# Patient Record
Sex: Male | Born: 1990 | Race: Black or African American | Hispanic: No | Marital: Single | Smoking: Current every day smoker
Health system: Southern US, Community
[De-identification: ages and names within clinical notes are randomized; demographics above are authoritative.]

## PROBLEM LIST (undated history)

## (undated) DIAGNOSIS — R011 Cardiac murmur, unspecified: Secondary | ICD-10-CM

## (undated) HISTORY — PX: TYMPANOSTOMY TUBE PLACEMENT: SHX32

---

## 2012-06-20 ENCOUNTER — Encounter (HOSPITAL_COMMUNITY): Payer: Self-pay | Admitting: *Deleted

## 2012-06-20 ENCOUNTER — Emergency Department (HOSPITAL_COMMUNITY)
Admission: EM | Admit: 2012-06-20 | Discharge: 2012-06-20 | Disposition: A | Discharge status: Home / Self Care | Payer: PRIVATE HEALTH INSURANCE | Attending: Emergency Medicine | Admitting: Emergency Medicine

## 2012-06-20 ENCOUNTER — Emergency Department (HOSPITAL_COMMUNITY): Payer: PRIVATE HEALTH INSURANCE

## 2012-06-20 DIAGNOSIS — Y9301 Activity, walking, marching and hiking: Secondary | ICD-10-CM | POA: Insufficient documentation

## 2012-06-20 DIAGNOSIS — W2209XA Striking against other stationary object, initial encounter: Secondary | ICD-10-CM | POA: Insufficient documentation

## 2012-06-20 DIAGNOSIS — S9032XA Contusion of left foot, initial encounter: Secondary | ICD-10-CM

## 2012-06-20 DIAGNOSIS — S9030XA Contusion of unspecified foot, initial encounter: Secondary | ICD-10-CM | POA: Insufficient documentation

## 2012-06-20 DIAGNOSIS — Y92009 Unspecified place in unspecified non-institutional (private) residence as the place of occurrence of the external cause: Secondary | ICD-10-CM | POA: Insufficient documentation

## 2012-06-20 MED ORDER — NAPROXEN 500 MG PO TABS: 500.0000 mg | ORAL_TABLET | Freq: Two times a day (BID) | ORAL | Status: DC

## 2012-06-20 NOTE — ED Provider Notes (Signed)
Medical screening examination/treatment/procedure(s) were performed by non-physician practitioner and as supervising physician I was immediately available for consultation/collaboration.  Flint Melter, MD 06/20/12 [phone]

## 2012-06-20 NOTE — ED Notes (Signed)
Pt reports he fell yesterday and hurt right foot. Pain 6/10 at present. Able to ambulate independently.

## 2012-06-20 NOTE — ED Notes (Addendum)
Pt reports he was going down the stairs when his R middle toe got caught in between the rails.  Small lac noted in between his R 4th and 3rd toes.  No active bleeding noted at this time.  Pt able to move his toes.  Mild swelling noted.  Pt denies falling and/or hitting his head.

## 2012-06-20 NOTE — ED Provider Notes (Signed)
History     CSN: [id number]  Arrival date & time 06/20/12  1144   First MD Initiated Contact with Patient 06/20/12 1218      Chief Complaint  Patient presents with  . Foot Pain    (Consider location/radiation/quality/duration/timing/severity/associated sxs/prior treatment) HPI Stephen Hart is a 22 y.o. male who presents to ED with complaint of a foot injury. States has walking down stairs barefoot in his house when he hit the railing, which split his great and 2nd toe, states hit his foot. Denies any other injuries. States took ibuprofen with some relief. States pain worsened with walking on that foot. Pain also with 2nd toe movement. No other complaints.    History reviewed. No pertinent past medical history.  History reviewed. No pertinent past surgical history.  History reviewed. No pertinent family history.  History  Substance Use Topics  . Smoking status: Never Smoker   . Smokeless tobacco: Not on file  . Alcohol Use: No      Review of Systems  Constitutional: Negative for fever and chills.  Musculoskeletal: Positive for joint swelling and arthralgias.  Neurological: Negative for weakness and numbness.    Allergies  Review of patient's allergies indicates not on file.  Home Medications  No current outpatient prescriptions on file.  There were no vitals taken for this visit.  Physical Exam  Nursing note and vitals reviewed. Constitutional: He appears well-developed and well-nourished. No distress.  Eyes: Conjunctivae are normal.  Cardiovascular: Normal rate, regular rhythm and normal heart sounds.   Pulmonary/Chest: Effort normal and breath sounds normal. No respiratory distress. He has no wheezes. He has no rales.  Musculoskeletal:  Normal appearing right foot. Tender to palpation over 2nd MTP joint. Pain with ROM of the great toe and 2nd toe. Small superficial abrasion between toe 2 and 3 on left foot. Full ROm of all toes. Normal cap refill <2 sec  distally of all toes.   Neurological: He is alert.  Skin: Skin is warm.    ED Course  Procedures (including critical care time)  No results found for this or any previous visit. Dg Foot Complete Right  06/20/2012  *RADIOLOGY REPORT*  Clinical Data: Traumatic injury with right foot pain  RIGHT FOOT COMPLETE - 3+ VIEW  Comparison: None.  Findings: No acute fracture or dislocation is noted.  A small accessory ossicle is noted laterally.  No soft tissue abnormality is seen.  IMPRESSION: No acute abnormality noted.   Original Report Authenticated By: Alcide Clever, M.D.       1. Foot contusion, left, initial encounter       MDM  Pt with foot injury yesterday. Exam unremarkable other than tenderness at MTP joint of the 2nd left toe. X-rays negative. Pt is ambulatory. Suspect contusion. Naprosyn at home, RICE therapy, follow up as needed.   Filed Vitals:   06/20/12 1241  BP: 128/72  Pulse: 69  Temp: 98.8 F (37.1 C)  TempSrc: Oral  Resp: 20  SpO2: 97%           Antoinette Haskett A Dalynn Jhaveri, PA-C 06/20/12 1323

## 2013-11-16 IMAGING — CR DG FOOT COMPLETE 3+V*R*
3 series · 3 of 3 positions shown · non-contrast
Comparison: None.

CLINICAL DATA: Traumatic injury with right foot pain

RIGHT FOOT COMPLETE - 3+ VIEW

[x foot ap right]
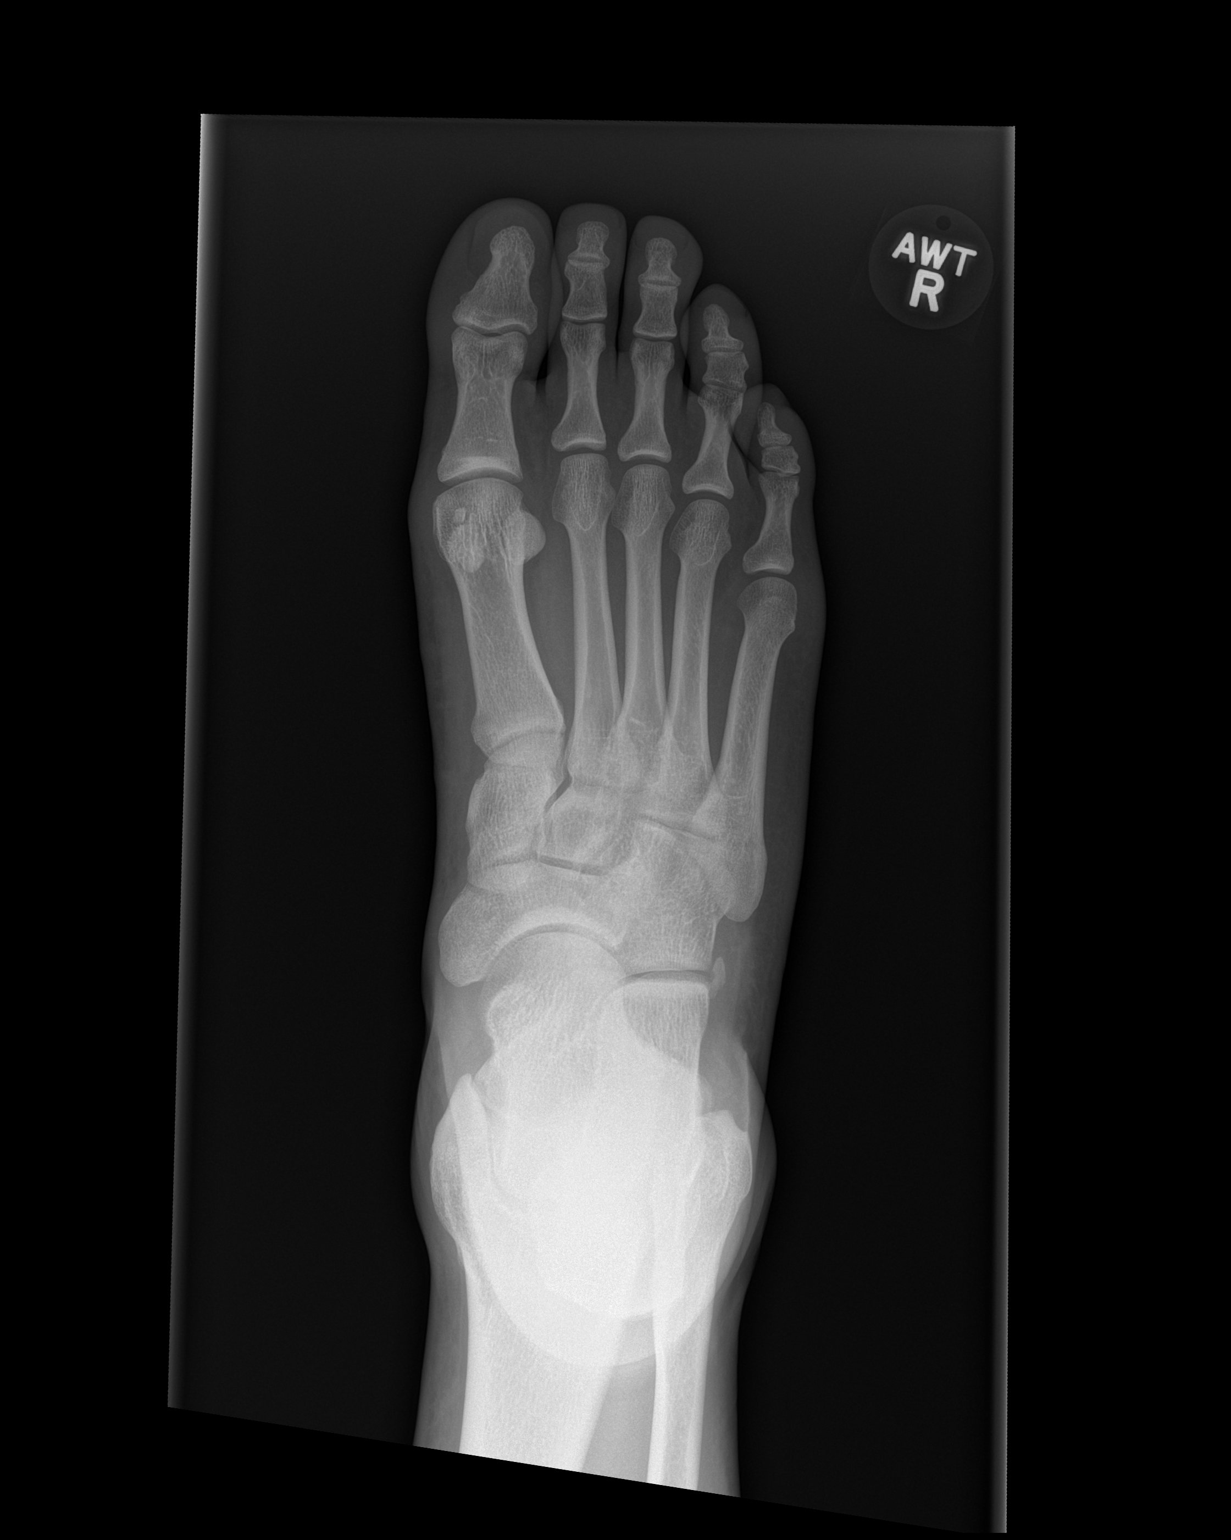

[x foot obl right]
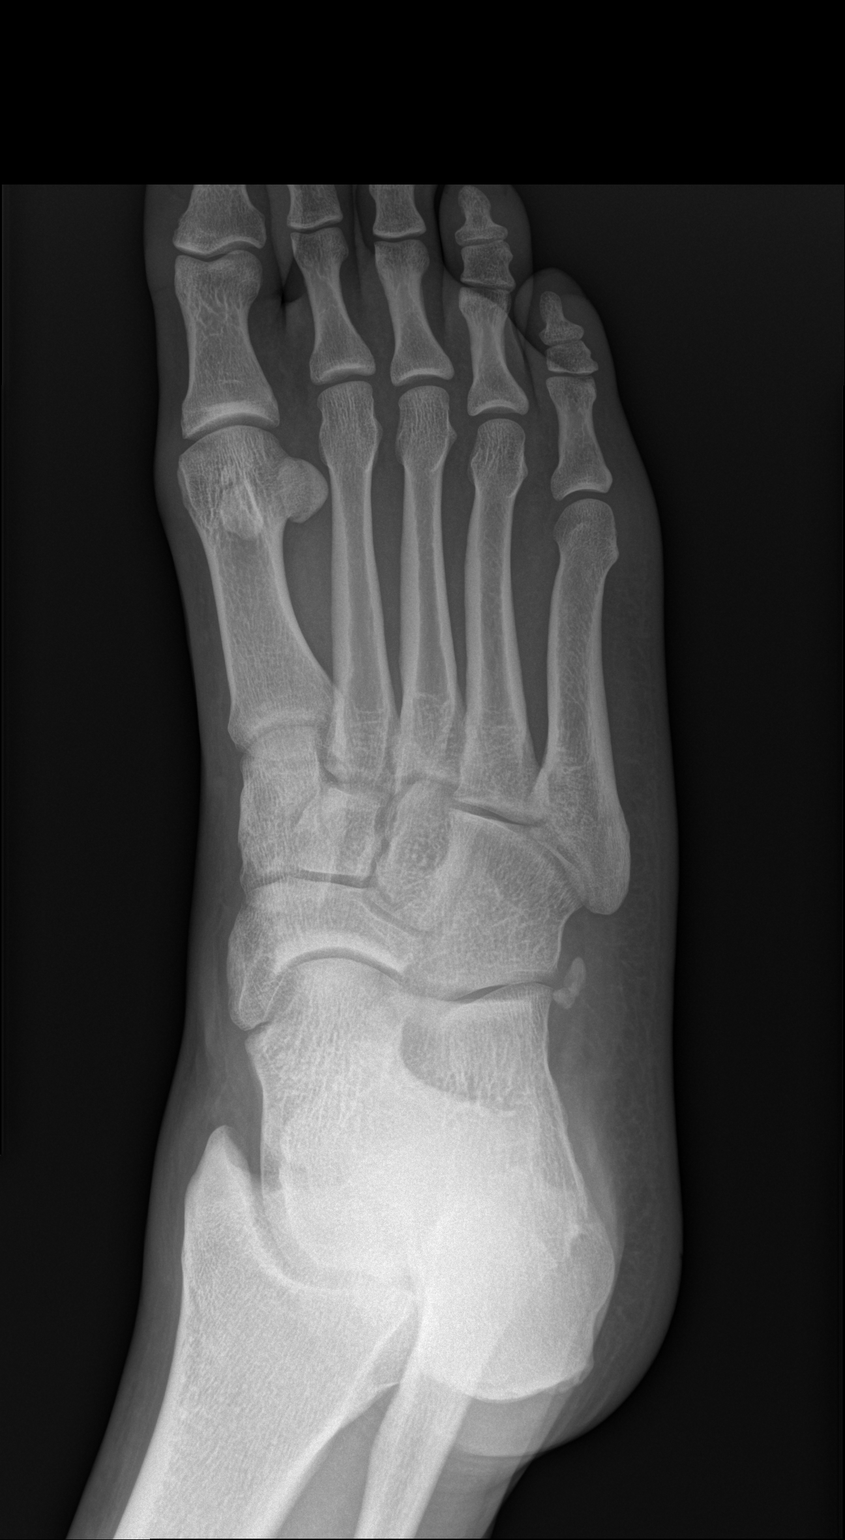

[x foot lat right]
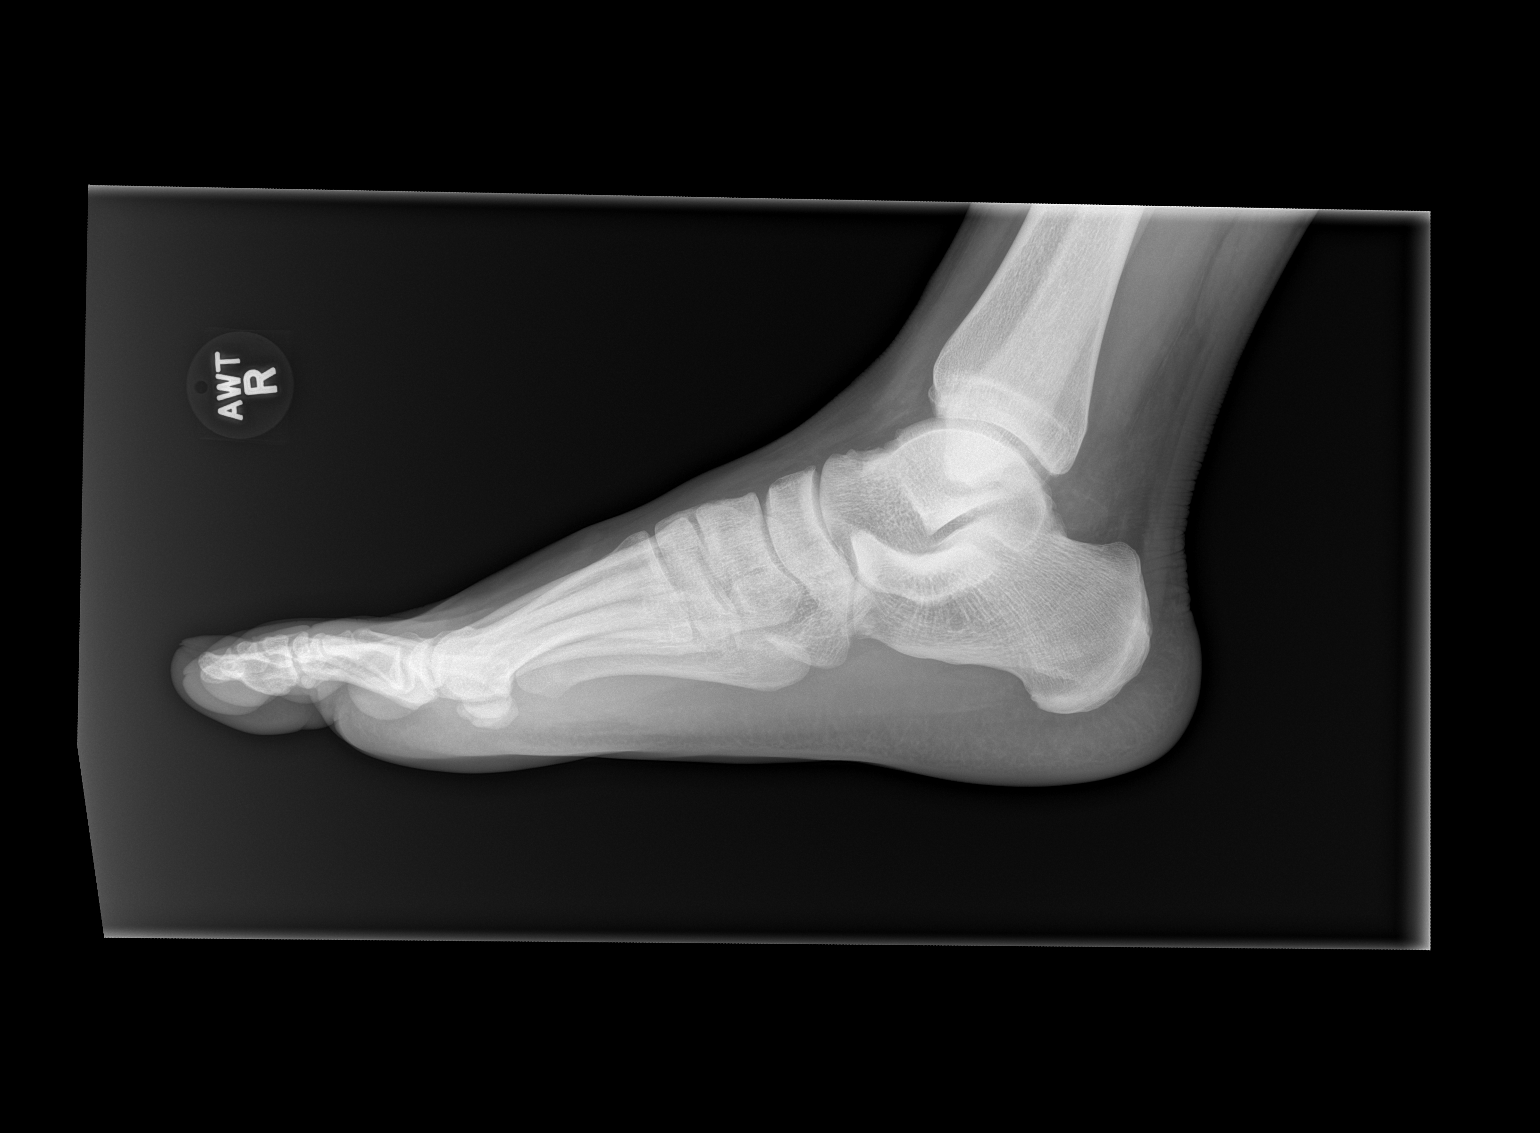

[3 of 3 positions shown; findings below may reference images not displayed]

FINDINGS: No acute fracture or dislocation is noted.  A small
accessory ossicle is noted laterally.  No soft tissue abnormality
is seen.
IMPRESSION: No acute abnormality noted.

## 2014-06-23 ENCOUNTER — Encounter (HOSPITAL_COMMUNITY): Payer: Self-pay | Admitting: *Deleted

## 2014-06-23 ENCOUNTER — Emergency Department (HOSPITAL_COMMUNITY)
Admission: EM | Admit: 2014-06-23 | Discharge: 2014-06-24 | Disposition: A | Discharge status: Home / Self Care | Payer: PRIVATE HEALTH INSURANCE | Attending: Emergency Medicine | Admitting: Emergency Medicine

## 2014-06-23 DIAGNOSIS — K088 Other specified disorders of teeth and supporting structures: Secondary | ICD-10-CM | POA: Insufficient documentation

## 2014-06-23 DIAGNOSIS — Z791 Long term (current) use of non-steroidal anti-inflammatories (NSAID): Secondary | ICD-10-CM | POA: Diagnosis not present

## 2014-06-23 DIAGNOSIS — Z720 Tobacco use: Secondary | ICD-10-CM | POA: Insufficient documentation

## 2014-06-23 DIAGNOSIS — K0889 Other specified disorders of teeth and supporting structures: Secondary | ICD-10-CM

## 2014-06-23 DIAGNOSIS — K029 Dental caries, unspecified: Secondary | ICD-10-CM | POA: Insufficient documentation

## 2014-06-23 DIAGNOSIS — Z880 Allergy status to penicillin: Secondary | ICD-10-CM | POA: Insufficient documentation

## 2014-06-23 DIAGNOSIS — K002 Abnormalities of size and form of teeth: Secondary | ICD-10-CM | POA: Diagnosis not present

## 2014-06-23 DIAGNOSIS — K0381 Cracked tooth: Secondary | ICD-10-CM | POA: Insufficient documentation

## 2014-06-23 DIAGNOSIS — R220 Localized swelling, mass and lump, head: Secondary | ICD-10-CM | POA: Diagnosis present

## 2014-06-23 NOTE — ED Notes (Signed)
Pt states that he thinks he may have chipped his tooth and is now experiencing dental pain. Tried whiskey and ibuprofen for pain with some relief.

## 2014-06-24 MED ORDER — DOXYCYCLINE HYCLATE 100 MG PO CAPS: 100.0000 mg | ORAL_CAPSULE | Freq: Two times a day (BID) | ORAL | Status: DC

## 2014-06-24 MED ORDER — HYDROCODONE-ACETAMINOPHEN 5-325 MG PO TABS: 1.0000 | ORAL_TABLET | Freq: Four times a day (QID) | ORAL | Status: DC | PRN

## 2014-06-24 MED ORDER — NAPROXEN 500 MG PO TABS: 500.0000 mg | ORAL_TABLET | Freq: Two times a day (BID) | ORAL | Status: DC | PRN

## 2014-06-24 NOTE — ED Provider Notes (Signed)
CSN: [id number]     Arrival date & time 06/23/14  2342 History  This chart was scribed for non-physician practitioner, Allen DerryMercedes Camprubi-Soms, PA-C working with Loren Raceravid Yelverton, MD by Greggory StallionKayla Andersen, ED scribe. This patient was seen in room TR08C/TR08C and the patient's care was started at 12:06 AM.   Chief Complaint  Patient presents with  . Facial Swelling   Patient is a 24 y.o. male presenting with tooth pain. The history is provided by the patient. No language interpreter was used.  Dental Pain Location:  Lower Lower teeth location:  19/LL 1st molar Quality: stabbing. Severity:  Moderate Onset quality:  Sudden Duration:  2 hours Timing:  Constant Progression:  Unchanged Chronicity:  New Context: dental fracture   Relieved by: iburpofen, orajel, and whiskey  Worsened by:  Pressure and cold food/drink (cold air, chewing, drinking) Ineffective treatments:  None tried Associated symptoms: facial pain, facial swelling, gum swelling and oral bleeding   Associated symptoms: no congestion, no difficulty swallowing, no drooling, no fever, no neck pain, no neck swelling, no oral lesions and no trismus   Risk factors: lack of dental care and smoking   Risk factors: no diabetes and no immunosuppression     HPI Comments: Stephen Hart is a 24 y.o. healthy male who presents to the Emergency Department complaining of left lower dental pain with associated facial and gum swelling that started earlier tonight when he broke a tooth eating chinese food. He denies pus drainage but states there has been bleeding from the area. He rates pain 6/10 and describes it as constant and stabbing, located on the entire L side of his face. Pain radiates into under his L jaw, and is worsened with cold air, chewing, and drinking. Pt has taken ibuprofen, whiskey, and used Orajel with some relief. He endorses having a small knot under the L side of his jaw which is tender. He denies fever, ear drainage, ear pain,  drooling, rhinorrhea, trismus, sore throat, difficulty swallowing, neck stiffness/pain, chest pain, SOB, abdominal pain, nausea, emesis, numbness, tingling, or weakness. Pt smokes cigarettes daily. He does not have a dentist.  Denies having diabetes or immunosuppressant medications/conditions.  History reviewed. No pertinent past medical history. History reviewed. No pertinent past surgical history. No family history on file. History  Substance Use Topics  . Smoking status: Never Smoker   . Smokeless tobacco: Not on file  . Alcohol Use: No    Review of Systems  Constitutional: Negative for fever and chills.  HENT: Positive for dental problem and facial swelling. Negative for congestion, drooling, ear discharge, ear pain, mouth sores, rhinorrhea, sore throat and trouble swallowing.   Respiratory: Negative for shortness of breath.   Cardiovascular: Negative for chest pain.  Gastrointestinal: Negative for nausea, vomiting, abdominal pain and diarrhea.  Musculoskeletal: Negative for neck pain and neck stiffness.  Skin: Negative for color change.  Allergic/Immunologic: Negative for immunocompromised state.  Neurological: Negative for weakness and numbness.  Hematological: Positive for adenopathy (L side of neck under jaw).  10 systems reviewed and are negative for acute changes except as noted in the HPI.  Allergie  Augmentin; Septra; Amoxicillin; and Penicillins  Home Medications   Prior to Admission medications   Medication Sig Start Date End Date Taking? Authorizing Provider  ibuprofen (ADVIL,MOTRIN) 200 MG tablet Take 1,000 mg by mouth every 6 (six) hours as needed for pain.    Historical Provider, MD  naproxen (NAPROSYN) 500 MG tablet Take 1 tablet (500 mg total) by mouth 2 (  two) times daily. 06/20/12   Tatyana Kirichenko, PA-C   BP 152/85 mmHg  Pulse 67  Temp(Src) 98 F (36.7 C) (Oral)  Resp 18  Ht  (1.905 m)  Wt 275 lb (124.739 kg)  BMI 34.37 kg/m2  SpO2 97%    Physical Exam  Constitutional: He is oriented to person, place, and time. Vital signs are normal. He appears well-developed and well-nourished.  Non-toxic appearance. No distress.  Afebrile, nontoxic, NAD  HENT:  Head: Normocephalic and atraumatic.  Right Ear: Hearing, tympanic membrane, external ear and ear canal normal.  Left Ear: Hearing, tympanic membrane, external ear and ear canal normal.  Nose: Nose normal.  Mouth/Throat: Uvula is midline, oropharynx is clear and moist and mucous membranes are normal. No oral lesions. No trismus in the jaw. Abnormal dentition. Dental caries present. No dental abscesses or uvula swelling.    Ears clear bilaterally. Left lower first molar #19 chipped around a filling, with exquisite TTP, no gingival erythema or swelling, no obvious abscess. Subfloor of mouth non TTP with no induration. Uvula midline with no swelling, no trismus or drooling. Multiple dental caries as well as dental fillings. No facial swelling.  Eyes: Conjunctivae and EOM are normal. Right eye exhibits no discharge. Left eye exhibits no discharge.  Neck: Normal range of motion. Neck supple.  Cardiovascular: Normal rate, regular rhythm, normal heart sounds and intact distal pulses.  Exam reveals no gallop and no friction rub.   No murmur heard. Pulmonary/Chest: Effort normal and breath sounds normal. No respiratory distress. He has no decreased breath sounds. He has no wheezes. He has no rhonchi. He has no rales.  Abdominal: Soft. Normal appearance and bowel sounds are normal. He exhibits no distension. There is no tenderness. There is no rigidity, no rebound and no guarding.  Musculoskeletal: Normal range of motion.  Lymphadenopathy:       Head (right side): No submandibular and no tonsillar adenopathy present.       Head (left side): Submandibular adenopathy present. No tonsillar adenopathy present.    He has no cervical adenopathy.  Left submandibular reactive LAD with mild TTP.    Neurological: He is alert and oriented to person, place, and time. He has normal strength. No sensory deficit.  Skin: Skin is warm, dry and intact. No rash noted.  Psychiatric: He has a normal mood and affect.  Nursing note and vitals reviewed.   ED Course  Procedures (including critical care time)  DIAGNOSTIC STUDIES: Oxygen Saturation is 97% on RA, normal by my interpretation.    COORDINATION OF CARE: 12:12 AM-Discussed treatment plan which includes doxycycline with pt at bedside and pt agreed to plan. Will give pt dental referrals and advised him to follow up.   Labs Review Labs Reviewed - No data to display  Imaging Review No results found.   EKG Interpretation None      MDM   Final diagnoses:  Pain, dental  Dental decay  Tobacco abuse    24 y.o. male here with Dental pain associated with dental fracture and possible dental infection with patient afebrile, non toxic appearing and swallowing secretions well. I gave patient referral to dentist and stressed the importance of dental follow up for ultimate management of dental pain. Counseled on smoking cessation.  I have also discussed reasons to return immediately to the ER.  Patient expresses understanding and agrees with plan.  I will also give doxycycline and pain control. Did not give any narcotics here since pt had consumed  alcohol prior to arrival. Discussed importance of not mixing narcotics with alcohol.  I personally performed the services described in this documentation, which was scribed in my presence. The recorded information has been reviewed and is accurate.  BP 152/85 mmHg  Pulse 67  Temp(Src) 98 F (36.7 C) (Oral)  Resp 18  Ht  (1.905 m)  Wt 275 lb (124.739 kg)  BMI 34.37 kg/m2  SpO2 97%  Meds ordered this encounter  Medications  . doxycycline (VIBRAMYCIN) 100 MG capsule    Sig: Take 1 capsule (100 mg total) by mouth 2 (two) times daily. One po bid x 7 days    Dispense:  14 capsule     Refill:  0    Order Specific Question:  Supervising Provider    Answer:  MILLER, BRIAN [[id number]]  . HYDROcodone-acetaminophen (NORCO) 5-325 MG per tablet    Sig: Take 1 tablet by mouth every 6 (six) hours as needed for severe pain.    Dispense:  6 tablet    Refill:  0    Order Specific Question:  Supervising Provider    Answer:  MILLER, BRIAN [[id number]]  . naproxen (NAPROSYN) 500 MG tablet    Sig: Take 1 tablet (500 mg total) by mouth 2 (two) times daily as needed for mild pain, moderate pain or headache (TAKE WITH MEALS.).    Dispense:  20 tablet    Refill:  0    Order Specific Question:  Supervising Provider    Answer:  Eber Hong [[id number]]     Hutchinson Isenberg Camprubi-Soms, PA-C 06/24/14 0030  Loren Racer, MD 06/24/14 [phone]

## 2014-06-24 NOTE — Discharge Instructions (Signed)
Apply warm compresses to jaw throughout the day. Take antibiotic until finished. Take naprosyn and norco as directed, as needed for pain but do not drive or operate machinery with pain medication use. Followup with a dentist is very important for ongoing evaluation and management of recurrent dental pain. Call one from the list below to have your tooth evaluated and treated. STOP SMOKING! Return to emergency department for emergent changing or worsening symptoms.    Dental Pain Toothache is pain in or around a tooth. It may get worse with chewing or with cold or heat.  HOME CARE  Your dentist may use a numbing medicine during treatment. If so, you may need to avoid eating until the medicine wears off. Ask your dentist about this.  Only take medicine as told by your dentist or doctor.  Avoid chewing food near the painful tooth until after all treatment is done. Ask your dentist about this. GET HELP RIGHT AWAY IF:   The problem gets worse or new problems appear.  You have a fever.  There is redness and puffiness (swelling) of the face, jaw, or neck.  You cannot open your mouth.  There is pain in the jaw.  There is very bad pain that is not helped by medicine. MAKE SURE YOU:   Understand these instructions.  Will watch your condition.  Will get help right away if you are not doing well or get worse. Document Released: 08/04/2007 Document Revised: 05/10/2011 Document Reviewed: 08/04/2007 [facility] Patient Information 2015 [place], [place]. This information is not intended to replace advice given to you by your health care provider. Make sure you discuss any questions you have with your health care provider.  Dental Caries Dental caries (also called tooth decay) is the most common oral disease. It can occur at any age but is more common in children and young adults.  HOW DENTAL CARIES DEVELOPS  The process of decay begins when bacteria and foods (particularly sugars and starches)  combine in your mouth to produce plaque. Plaque is a substance that sticks to the hard, outer surface of a tooth (enamel). The bacteria in plaque produce acids that attack enamel. These acids may also attack the root surface of a tooth (cementum) if it is exposed. Repeated attacks dissolve these surfaces and create holes in the tooth (cavities). If left untreated, the acids destroy the other layers of the tooth.  RISK FACTORS  Frequent sipping of sugary beverages.   Frequent snacking on sugary and starchy foods, especially those that easily get stuck in the teeth.   Poor oral hygiene.   Dry mouth.   Substance abuse such as methamphetamine abuse.   Broken or poor-fitting dental restorations.   Eating disorders.   Gastroesophageal reflux disease (GERD).   Certain radiation treatments to the head and neck. SYMPTOMS In the early stages of dental caries, symptoms are seldom present. Sometimes white, chalky areas may be seen on the enamel or other tooth layers. In later stages, symptoms may include:  Pits and holes on the enamel.  Toothache after sweet, hot, or cold foods or drinks are consumed.  Pain around the tooth.  Swelling around the tooth. DIAGNOSIS  Most of the time, dental caries is detected during a regular dental checkup. A diagnosis is made after a thorough medical and dental history is taken and the surfaces of your teeth are checked for signs of dental caries. Sometimes special instruments, such as lasers, are used to check for dental caries. Dental X-ray exams may  be taken so that areas not visible to the eye (such as between the contact areas of the teeth) can be checked for cavities.  TREATMENT  If dental caries is in its early stages, it may be reversed with a fluoride treatment or an application of a remineralizing agent at the dental office. Thorough brushing and flossing at home is needed to aid these treatments. If it is in its later stages, treatment depends  on the location and extent of tooth destruction:   If a small area of the tooth has been destroyed, the destroyed area will be removed and cavities will be filled with a material such as gold, silver amalgam, or composite resin.   If a large area of the tooth has been destroyed, the destroyed area will be removed and a cap (crown) will be fitted over the remaining tooth structure.   If the center part of the tooth (pulp) is affected, a procedure called a root canal will be needed before a filling or crown can be placed.   If most of the tooth has been destroyed, the tooth may need to be pulled (extracted). HOME CARE INSTRUCTIONS You can prevent, stop, or reverse dental caries at home by practicing good oral hygiene. Good oral hygiene includes:  Thoroughly cleaning your teeth at least twice a day with a toothbrush and dental floss.   Using a fluoride toothpaste. A fluoride mouth rinse may also be used if recommended by your dentist or health care provider.   Restricting the amount of sugary and starchy foods and sugary liquids you consume.   Avoiding frequent snacking on these foods and sipping of these liquids.   Keeping regular visits with a dentist for checkups and cleanings. PREVENTION   Practice good oral hygiene.  Consider a dental sealant. A dental sealant is a coating material that is applied by your dentist to the pits and grooves of teeth. The sealant prevents food from being trapped in them. It may protect the teeth for several years.  Ask about fluoride supplements if you live in a community without fluorinated water or with water that has a low fluoride content. Use fluoride supplements as directed by your dentist or health care provider.  Allow fluoride varnish applications to teeth if directed by your dentist or health care provider. Document Released: 11/07/2001 Document Revised: 07/02/2013 Document Reviewed: 02/18/2012 [facility] Patient Information 2015  [place], [place]. This information is not intended to replace advice given to you by your health care provider. Make sure you discuss any questions you have with your health care provider.  Smoking Cessation Quitting smoking is important to your health and has many advantages. However, it is not always easy to quit since nicotine is a very addictive drug. Oftentimes, people try 3 times or more before being able to quit. This document explains the best ways for you to prepare to quit smoking. Quitting takes hard work and a lot of effort, but you can do it. ADVANTAGES OF QUITTING SMOKING  You will live longer, feel better, and live better.  Your body will feel the impact of quitting smoking almost immediately.  Within 20 minutes, blood pressure decreases. Your pulse returns to its normal level.  After 8 hours, carbon monoxide levels in the blood return to normal. Your oxygen level increases.  After 24 hours, the chance of having a heart attack starts to decrease. Your breath, hair, and body stop smelling like smoke.  After 48 hours, damaged nerve endings begin to recover. Your  sense of taste and smell improve.  After 72 hours, the body is virtually free of nicotine. Your bronchial tubes relax and breathing becomes easier.  After 2 to 12 weeks, lungs can hold more air. Exercise becomes easier and circulation improves.  The risk of having a heart attack, stroke, cancer, or lung disease is greatly reduced.  After 1 year, the risk of coronary heart disease is cut in half.  After 5 years, the risk of stroke falls to the same as a nonsmoker.  After 10 years, the risk of lung cancer is cut in half and the risk of other cancers decreases significantly.  After 15 years, the risk of coronary heart disease drops, usually to the level of a nonsmoker.  If you are pregnant, quitting smoking will improve your chances of having a healthy baby.  The people you live with, especially any children, will be  healthier.  You will have extra money to spend on things other than cigarettes. QUESTIONS TO THINK ABOUT BEFORE ATTEMPTING TO QUIT You may want to talk about your answers with your health care provider.  Why do you want to quit?  If you tried to quit in the past, what helped and what did not?  What will be the most difficult situations for you after you quit? How will you plan to handle them?  Who can help you through the tough times? Your family? Friends? A health care provider?  What pleasures do you get from smoking? What ways can you still get pleasure if you quit? Here are some questions to ask your health care provider:  How can you help me to be successful at quitting?  What medicine do you think would be best for me and how should I take it?  What should I do if I need more help?  What is smoking withdrawal like? How can I get information on withdrawal? GET READY  Set a quit date.  Change your environment by getting rid of all cigarettes, ashtrays, matches, and lighters in your home, car, or work. Do not let people smoke in your home.  Review your past attempts to quit. Think about what worked and what did not. GET SUPPORT AND ENCOURAGEMENT You have a better chance of being successful if you have help. You can get support in many ways.  Tell your family, friends, and coworkers that you are going to quit and need their support. Ask them not to smoke around you.  Get individual, group, or telephone counseling and support. Programs are available at [employer] and health centers. Call your local health department for information about programs in your area.  Spiritual beliefs and practices may help some smokers quit.  Download a "quit meter" on your computer to keep track of quit statistics, such as how long you have gone without smoking, cigarettes not smoked, and money saved.  Get a self-help book about quitting smoking and staying off tobacco. LEARN NEW SKILLS  AND BEHAVIORS  Distract yourself from urges to smoke. Talk to someone, go for a walk, or occupy your time with a task.  Change your normal routine. Take a different route to work. Drink tea instead of coffee. Eat breakfast in a different place.  Reduce your stress. Take a hot bath, exercise, or read a book.  Plan something enjoyable to do every day. Reward yourself for not smoking.  Explore interactive web-based programs that specialize in helping you quit. GET MEDICINE AND USE IT CORRECTLY Medicines can help you  stop smoking and decrease the urge to smoke. Combining medicine with the above behavioral methods and support can greatly increase your chances of successfully quitting smoking.  Nicotine replacement therapy helps deliver nicotine to your body without the negative effects and risks of smoking. Nicotine replacement therapy includes nicotine gum, lozenges, inhalers, nasal sprays, and skin patches. Some may be available over-the-counter and others require a prescription.  Antidepressant medicine helps people abstain from smoking, but how this works is unknown. This medicine is available by prescription.  Nicotinic receptor partial agonist medicine simulates the effect of nicotine in your brain. This medicine is available by prescription. Ask your health care provider for advice about which medicines to use and how to use them based on your health history. Your health care provider will tell you what side effects to look out for if you choose to be on a medicine or therapy. Carefully read the information on the package. Do not use any other product containing nicotine while using a nicotine replacement product.  RELAPSE OR DIFFICULT SITUATIONS Most relapses occur within the first 3 months after quitting. Do not be discouraged if you start smoking again. Remember, most people try several times before finally quitting. You may have symptoms of withdrawal because your body is used to nicotine.  You may crave cigarettes, be irritable, feel very hungry, cough often, get headaches, or have difficulty concentrating. The withdrawal symptoms are only temporary. They are strongest when you first quit, but they will go away within 10-14 days. To reduce the chances of relapse, try to:  Avoid drinking alcohol. Drinking lowers your chances of successfully quitting.  Reduce the amount of caffeine you consume. Once you quit smoking, the amount of caffeine in your body increases and can give you symptoms, such as a rapid heartbeat, sweating, and anxiety.  Avoid smokers because they can make you want to smoke.  Do not let weight gain distract you. Many smokers will gain weight when they quit, usually less than 10 pounds. Eat a healthy diet and stay active. You can always lose the weight gained after you quit.  Find ways to improve your mood other than smoking. FOR MORE INFORMATION  www.smokefree.gov  Document Released: 02/09/2001 Document Revised: 07/02/2013 Document Reviewed: 05/27/2011 [facility] Patient Information 2015 [place], [place]. This information is not intended to replace advice given to you by your health care provider. Make sure you discuss any questions you have with your health care provider.  Smoking Cessation, Tips for Success If you are ready to quit smoking, congratulations! You have chosen to help yourself be healthier. Cigarettes bring nicotine, tar, carbon monoxide, and other irritants into your body. Your lungs, heart, and blood vessels will be able to work better without these poisons. There are many different ways to quit smoking. Nicotine gum, nicotine patches, a nicotine inhaler, or nicotine nasal spray can help with physical craving. Hypnosis, support groups, and medicines help break the habit of smoking. WHAT THINGS CAN I DO TO MAKE QUITTING EASIER?  Here are some tips to help you quit for good:  Pick a date when you will quit smoking completely. Tell all of your friends  and family about your plan to quit on that date.  Do not try to slowly cut down on the number of cigarettes you are smoking. Pick a quit date and quit smoking completely starting on that day.  Throw away all cigarettes.   Clean and remove all ashtrays from your home, work, and car.  On a card,  write down your reasons for quitting. Carry the card with you and read it when you get the urge to smoke.  Cleanse your body of nicotine. Drink enough water and fluids to keep your urine clear or pale yellow. Do this after quitting to flush the nicotine from your body.  Learn to predict your moods. Do not let a bad situation be your excuse to have a cigarette. Some situations in your life might tempt you into wanting a cigarette.  Never have "just one" cigarette. It leads to wanting another and another. Remind yourself of your decision to quit.  Change habits associated with smoking. If you smoked while driving or when feeling stressed, try other activities to replace smoking. Stand up when drinking your coffee. Brush your teeth after eating. Sit in a different chair when you read the paper. Avoid alcohol while trying to quit, and try to drink fewer caffeinated beverages. Alcohol and caffeine may urge you to smoke.  Avoid foods and drinks that can trigger a desire to smoke, such as sugary or spicy foods and alcohol.  Ask people who smoke not to smoke around you.  Have something planned to do right after eating or having a cup of coffee. For example, plan to take a walk or exercise.  Try a relaxation exercise to calm you down and decrease your stress. Remember, you may be tense and nervous for the first 2 weeks after you quit, but this will pass.  Find new activities to keep your hands busy. Play with a pen, coin, or rubber band. Doodle or draw things on paper.  Brush your teeth right after eating. This will help cut down on the craving for the taste of tobacco after meals. You can also try  mouthwash.   Use oral substitutes in place of cigarettes. Try using lemon drops, carrots, cinnamon sticks, or chewing gum. Keep them handy so they are available when you have the urge to smoke.  When you have the urge to smoke, try deep breathing.  Designate your home as a nonsmoking area.  If you are a heavy smoker, ask your health care provider about a prescription for nicotine chewing gum. It can ease your withdrawal from nicotine.  Reward yourself. Set aside the cigarette money you save and buy yourself something nice.  Look for support from others. Join a support group or smoking cessation program. Ask someone at home or at work to help you with your plan to quit smoking.  Always ask yourself, "Do I need this cigarette or is this just a reflex?" Tell yourself, "Today, I choose not to smoke," or "I do not want to smoke." You are reminding yourself of your decision to quit.  Do not replace cigarette smoking with electronic cigarettes (commonly called e-cigarettes). The safety of e-cigarettes is unknown, and some may contain harmful chemicals.  If you relapse, do not give up! Plan ahead and think about what you will do the next time you get the urge to smoke. HOW WILL I FEEL WHEN I QUIT SMOKING? You may have symptoms of withdrawal because your body is used to nicotine (the addictive substance in cigarettes). You may crave cigarettes, be irritable, feel very hungry, cough often, get headaches, or have difficulty concentrating. The withdrawal symptoms are only temporary. They are strongest when you first quit but will go away within 10-14 days. When withdrawal symptoms occur, stay in control. Think about your reasons for quitting. Remind yourself that these are signs that your body is  healing and getting used to being without cigarettes. Remember that withdrawal symptoms are easier to treat than the major diseases that smoking can cause.  Even after the withdrawal is over, expect periodic urges  to smoke. However, these cravings are generally short lived and will go away whether you smoke or not. Do not smoke! WHAT RESOURCES ARE AVAILABLE TO HELP ME QUIT SMOKING? Your health care provider can direct you to community resources or hospitals for support, which may include:  Group support.  Education.  Hypnosis.  Therapy. Document Released: 11/14/2003 Document Revised: 07/02/2013 Document Reviewed: 08/03/2012 [facility] Patient Information 2015 [place], [place]. This information is not intended to replace advice given to you by your health care provider. Make sure you discuss any questions you have with your health care provider.   Emergency Department Resource Guide 1) Find a Doctor and Pay Out of Pocket Although you won't have to find out who is covered by your insurance plan, it is a good idea to ask around and get recommendations. You will then need to call the office and see if the doctor you have chosen will accept you as a new patient and what types of options they offer for patients who are self-pay. Some doctors offer discounts or will set up payment plans for their patients who do not have insurance, but you will need to ask so you aren't surprised when you get to your appointment.  2) Contact Your Local Health Department Not all health departments have doctors that can see patients for sick visits, but many do, so it is worth a call to see if yours does. If you don't know where your local health department is, you can check in your phone book. The CDC also has a tool to help you locate your state's health department, and many state websites also have listings of all of their local health departments.  3) Find a Walk-in Clinic If your illness is not likely to be very severe or complicated, you may want to try a walk in clinic. These are popping up all over the country in pharmacies, drugstores, and shopping centers. They're usually staffed by nurse practitioners or physician  assistants that have been trained to treat common illnesses and complaints. They're usually fairly quick and inexpensive. However, if you have serious medical issues or chronic medical problems, these are probably not your best option.  No Primary Care Doctor: - Call [facility] at  [phone] - they can help you locate a primary care doctor that  accepts your insurance, provides certain services, etc. - Physician Referral Service- [phone]  Chronic Pain Problems: Organization         Address  Phone   Notes  [facility]  [phone] Patients need to be referred by their primary care doctor.   Medication Assistance: Organization         Address  Phone   Notes  [facility] Medication [facility] [address] [place], [place] [address] [phone] --Must be a resident of [facility] -- Must have NO insurance coverage whatsoever (no Medicaid/ Medicare, etc.) -- The pt. MUST have a primary care doctor that directs their care regularly and follows them in the community   [facility]  [phone]   [employer]  [phone]     Dental Care: Organization         Address  Phone  Notes  [facility] Department of Public Health [facility] [phone]  [address] [place], [place] [phone] Accepts children up to age 56 who are enrolled in [place] or [place] Health Choice; pregnant women with a Medicaid card; and children who have applied for Medicaid or [place] Health Choice, but were declined, whose parents can pay a reduced fee at time of service.  [facility] Department of [facility]  [address], [place] [phone] Accepts children up to age 67 who are enrolled in [place] or [place] Health Choice; pregnant women with a Medicaid card; and children who have applied for Medicaid or  Health Choice, but were declined, whose parents can pay a reduced fee at time of service.  [facility]  [address] [place], [place] [phone] Patients are seen by appointment only. Walk-ins are not accepted. [facility] will see patients 4 years of age and older. Monday - Tuesday (8am-5pm) Most Wednesdays (8:30-5pm) $30 per visit, cash only  [facility]  [address], [place] [facility] [phone] Patients are seen by appointment only. Walk-ins are not accepted. [facility] will see patients 45 years of age and older. One Wednesday Evening (Monthly: Volunteer Based).  $30 per visit, cash only  [employer] [facility] [employer]  [phone] for adults; Children under age 62, call Graduate Pediatric Dentistry at [phone]. Children aged 11-14, please call [phone] to request a pediatric application.  Dental services are provided in all areas of dental care including fillings, crowns and bridges, complete and partial dentures, implants, gum treatment, root canals, and extractions. Preventive care is also provided. Treatment is provided to both adults and children. Patients are selected via a lottery and there is often a waiting list.   [facility] [address] [place]  [phone] [email or web address]   [facility] [address] [place], [place] [phone], Ext. [phone] Second and Fourth Thursday of each month, opens at 6:30 AM; Clinic ends at 9 AM.  Patients are seen on a first-come first-served basis, and a limited number are seen during each clinic.   [facility]  [address] [place], [place] [phone]   Eligibility Requirements You must have lived in [place], [place], or [place] counties for at least the last three months.   You cannot be eligible for state or federal sponsored [place], including [employer], [place], or [employer].   You generally cannot be eligible for healthcare insurance through your employer.    How to apply: Eligibility  screenings are held every Tuesday and Wednesday afternoon from 1:00 pm until 4:00 pm. You do not need an appointment for the interview!  [facility] [address], [place], [place] [phone]   [facility] Health Department  [phone]   [facility] Health Department  [phone]   [facility] Health Department  [phone]

## 2016-08-06 ENCOUNTER — Emergency Department (HOSPITAL_COMMUNITY)
Admission: EM | Admit: 2016-08-06 | Discharge: 2016-08-06 | Disposition: A | Discharge status: Home / Self Care | Payer: Self-pay | Attending: Emergency Medicine | Admitting: Emergency Medicine

## 2016-08-06 ENCOUNTER — Encounter (HOSPITAL_COMMUNITY): Payer: Self-pay | Admitting: Emergency Medicine

## 2016-08-06 DIAGNOSIS — H9209 Otalgia, unspecified ear: Secondary | ICD-10-CM | POA: Insufficient documentation

## 2016-08-06 DIAGNOSIS — J029 Acute pharyngitis, unspecified: Secondary | ICD-10-CM | POA: Insufficient documentation

## 2016-08-06 DIAGNOSIS — F17200 Nicotine dependence, unspecified, uncomplicated: Secondary | ICD-10-CM | POA: Insufficient documentation

## 2016-08-06 HISTORY — DX: Cardiac murmur, unspecified: R01.1

## 2016-08-06 LAB — RAPID STREP SCREEN (MED CTR MEBANE ONLY): Streptococcus, Group A Screen (Direct): NEGATIVE

## 2016-08-06 MED ORDER — PREDNISONE 10 MG PO TABS: 20.0000 mg | ORAL_TABLET | Freq: Two times a day (BID) | ORAL | 0 refills | Status: DC

## 2016-08-06 NOTE — ED Provider Notes (Signed)
[facility]-EMERGENCY DEPT Provider Note   CSN: [id number] Arrival date & time: 08/06/16  0302  By signing my name below, I, Cynda AcresHailei Fulton, attest that this documentation has been prepared under the direction and in the presence of Geoffery Lyonselo, Jayen Bromwell, MD. Electronically Signed: Cynda AcresHailei Fulton, Scribe. 08/06/16. 3:27 AM.  History   Chief Complaint Chief Complaint  Patient presents with  . Sore Throat   HPI Comments: Stephen Hart is a 26 y.o. male with no pertinent past medical history, who presents to the Emergency Department complaining of a gradual-onset, persistent sore throat that began three days ago. Patient states he has had trouble swallowing and a sore throat ever since eating pizza several days ago. Patient states he has never eaten pizza at this restaurant in the past. Patient reports associated ear pain and shortness of breath. Patient reports taking tylenol, ibuprofen, and Claritin D. Patient denies any fever, chills, nausea, vomiting, diarrhea, abdominal pain, or any additional symptoms.   The history is provided by the patient. No language interpreter was used.    Past Medical History:  Diagnosis Date  . Heart murmur     There are no active problems to display for this patient.   History reviewed. No pertinent surgical history.     Home Medications    Prior to Admission medications   Medication Sig Start Date End Date Taking? Authorizing Provider  doxycycline (VIBRAMYCIN) 100 MG capsule Take 1 capsule (100 mg total) by mouth 2 (two) times daily. One po bid x 7 days 06/24/14   Street, [place], PA-C  HYDROcodone-acetaminophen (NORCO) 5-325 MG per tablet Take 1 tablet by mouth every 6 (six) hours as needed for severe pain. 06/24/14   Street, [place], PA-C  ibuprofen (ADVIL,MOTRIN) 200 MG tablet Take 1,000 mg by mouth every 6 (six) hours as needed for pain.    [provider]  naproxen (NAPROSYN) 500 MG tablet Take 1 tablet (500 mg total) by mouth 2 (two) times daily.  06/20/12   Kirichenko, Tatyana, PA-C  naproxen (NAPROSYN) 500 MG tablet Take 1 tablet (500 mg total) by mouth 2 (two) times daily as needed for mild pain, moderate pain or headache (TAKE WITH MEALS.). 06/24/14   Street, [place], PA-C    Family History No family history on file.  Social History Social History  Substance Use Topics  . Smoking status: Current Every Day Smoker    Packs/day: 1.00  . Smokeless tobacco: Not on file  . Alcohol use No     Allergies   Augmentin [amoxicillin-pot clavulanate]; Septra [sulfamethoxazole-trimethoprim]; Amoxicillin; and Penicillins   Review of Systems Review of Systems  Constitutional: Negative for chills and fever.  HENT: Positive for ear pain, sore throat and trouble swallowing. Negative for congestion.   Gastrointestinal: Negative for abdominal pain, diarrhea, nausea and vomiting.  All other systems reviewed and are negative.    Physical Exam Updated Vital Signs BP 131/90   Pulse 83   Temp 98.3 F (36.8 C) (Oral)   Resp 16   Ht 6\' 5"  (1.956 m)   Wt (!) 322 lb 6 oz (146.2 kg)   SpO2 98%   BMI 38.23 kg/m   Physical Exam  Constitutional: He is oriented to person, place, and time. He appears well-developed and well-nourished.  HENT:  Head: Normocephalic and atraumatic.  Mouth/Throat: Oropharynx is clear and moist. No oropharyngeal exudate.  Oropharynx erythematous with no exudate.   Eyes: Pupils are equal, round, and reactive to light.  Neck: Normal range of motion. Neck supple.  Cardiovascular: Normal rate and regular rhythm.   No murmur heard. Pulmonary/Chest: Effort normal and breath sounds normal. No stridor. No respiratory distress. He has no wheezes. He has no rales.  Abdominal: Soft. Bowel sounds are normal. He exhibits no distension. There is no tenderness.  Musculoskeletal: Normal range of motion.  Lymphadenopathy:    He has no cervical adenopathy.  Neurological: He is alert and oriented to person, place, and time.    Skin: Skin is warm and dry.  Psychiatric: He has a normal mood and affect.  Nursing note and vitals reviewed.    ED Treatments / Results  DIAGNOSTIC STUDIES: Oxygen Saturation is 98% on RA, normal by my interpretation.    COORDINATION OF CARE: 3:26 AM Discussed treatment plan with pt at bedside and pt agreed to plan, which includes a strep test.  Labs (all labs ordered are listed, but only abnormal results are displayed) Labs Reviewed  RAPID STREP SCREEN (NOT AT [facility])    EKG  EKG Interpretation None       Radiology No results found.  Procedures Procedures (including critical care time)  Medications Ordered in ED Medications - No data to display   Initial Impression / Assessment and Plan / ED Course  I have reviewed the triage vital signs and the nursing notes.  Pertinent labs & imaging results that were available during my care of the patient were reviewed by me and considered in my medical decision making (see chart for details).  Strep test negative. Symptoms likely viral in nature. His oropharynx is erythematous, however there are no exudates or significant swelling. There is no trismus. This will be treated with prednisone and follow-up as needed.  Final Clinical Impressions(s) / ED Diagnoses   Final diagnoses:  None    New Prescriptions New Prescriptions   No medications on file   I personally performed the services described in this documentation, which was scribed in my presence. The recorded information has been reviewed and is accurate.        Geoffery Lyons, MD 08/06/16 [phone]

## 2016-08-06 NOTE — Discharge Instructions (Signed)
Prednisone as prescribed.  Follow-up with your Dr. if not improving in the next 4-5 days, and return to the emergency department if symptoms significantly worsen or change.

## 2016-08-06 NOTE — ED Triage Notes (Signed)
Pt reports sore throat and difficulty swallowing after eating pizza several days ago. States pain increased today, difficult to control with OTC pain meds. States swelling makes it difficult to breath. Sounds congested in triage.

## 2016-08-08 LAB — CULTURE, GROUP A STREP (THRC)

## 2017-05-04 ENCOUNTER — Ambulatory Visit (INDEPENDENT_AMBULATORY_CARE_PROVIDER_SITE_OTHER): Payer: Self-pay | Admitting: Ophthalmology

## 2017-05-04 ENCOUNTER — Encounter (HOSPITAL_COMMUNITY): Payer: Self-pay | Admitting: Emergency Medicine

## 2017-05-04 ENCOUNTER — Encounter (INDEPENDENT_AMBULATORY_CARE_PROVIDER_SITE_OTHER): Payer: Self-pay | Admitting: Ophthalmology

## 2017-05-04 ENCOUNTER — Other Ambulatory Visit: Payer: Self-pay

## 2017-05-04 ENCOUNTER — Emergency Department (HOSPITAL_COMMUNITY)
Admission: EM | Admit: 2017-05-04 | Discharge: 2017-05-04 | Disposition: A | Discharge status: Home / Self Care | Payer: Self-pay | Attending: Emergency Medicine | Admitting: Emergency Medicine

## 2017-05-04 DIAGNOSIS — H5789 Other specified disorders of eye and adnexa: Secondary | ICD-10-CM | POA: Insufficient documentation

## 2017-05-04 DIAGNOSIS — H16213 Exposure keratoconjunctivitis, bilateral: Secondary | ICD-10-CM

## 2017-05-04 DIAGNOSIS — Z23 Encounter for immunization: Secondary | ICD-10-CM | POA: Insufficient documentation

## 2017-05-04 DIAGNOSIS — F17210 Nicotine dependence, cigarettes, uncomplicated: Secondary | ICD-10-CM | POA: Insufficient documentation

## 2017-05-04 DIAGNOSIS — H3581 Retinal edema: Secondary | ICD-10-CM

## 2017-05-04 DIAGNOSIS — H1033 Unspecified acute conjunctivitis, bilateral: Secondary | ICD-10-CM | POA: Insufficient documentation

## 2017-05-04 DIAGNOSIS — H579 Unspecified disorder of eye and adnexa: Secondary | ICD-10-CM

## 2017-05-04 MED ORDER — TETANUS-DIPHTH-ACELL PERTUSSIS 5-2.5-18.5 LF-MCG/0.5 IM SUSP
0.5000 mL | Freq: Once | INTRAMUSCULAR | Status: AC
  Administered 2017-05-04: 0.5 mL via INTRAMUSCULAR
  Filled 2017-05-04: qty 0.5

## 2017-05-04 MED ORDER — TETRACAINE HCL 0.5 % OP SOLN
1.0000 [drp] | Freq: Once | OPHTHALMIC | Status: AC
  Administered 2017-05-04: 1 [drp] via OPHTHALMIC
  Filled 2017-05-04: qty 4

## 2017-05-04 MED ORDER — FLUORESCEIN SODIUM 1 MG OP STRP
ORAL_STRIP | OPHTHALMIC | Status: AC
  Administered 2017-05-04: 09:00:00
  Filled 2017-05-04: qty 2

## 2017-05-04 MED ORDER — OFLOXACIN 0.3 % OP SOLN
1.0000 [drp] | Freq: Four times a day (QID) | OPHTHALMIC | Status: DC
  Administered 2017-05-04: 1 [drp] via OPHTHALMIC
  Filled 2017-05-04: qty 5

## 2017-05-04 MED ORDER — KETOROLAC TROMETHAMINE 0.5 % OP SOLN
1.0000 [drp] | Freq: Four times a day (QID) | OPHTHALMIC | Status: DC
  Administered 2017-05-04: 1 [drp] via OPHTHALMIC
  Filled 2017-05-04: qty 3
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
## 2017-05-04 NOTE — ED Notes (Signed)
Attempted use of morgan lens to flush, patient reports having PTSD and did not like it. PT taken to eyewash station, not much success.  Little drops from syringe and patient reports feeling better-denies pain. He reports that he feels like it is dry because he felt better with the drops but  once he dries his eyes, the pain came back

## 2017-05-04 NOTE — ED Provider Notes (Signed)
[facility] EMERGENCY DEPARTMENT Provider Note  CSN: [id number] Arrival date & time: 05/04/17 4098  Chief Complaint(s) Eye Pain  HPI Stephen Hart is a 27 y.o. male who presents with bilateral eye irritation, right greater than left.  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  Patient reports that he was wearing the appropriate safety gear but feels like something flew into his eyes.  States that he has a foreign body sensation in the right eye.  Pain is exacerbated with eye movement.  States that is improved when he holds his right upper eyelid open and away from his eye.  Also states that both eyes feel irritated.  Denies any trauma.  States that vision is intact.  Denies any other physical complaints.  HPI  Past Medical History Past Medical History:  Diagnosis Date  . Heart murmur    There are no active problems to display for this patient.  Home Medication(s) Prior to Admission medications   Medication Sig Start Date End Date Taking? Authorizing Provider  ibuprofen (ADVIL,MOTRIN) 200 MG tablet Take 200-800 mg by mouth every 6 (six) hours as needed for moderate pain.    Yes [provider]                                                                                                                                    Past Surgical History History reviewed. No pertinent surgical history. Family History No family history on file.  Social History Social History   Tobacco Use  . Smoking status: Current Every Day Smoker    Packs/day: 1.00  Substance Use Topics  . Alcohol use: No  . Drug use: No   Allergies Augmentin [amoxicillin-pot clavulanate]; Septra [sulfamethoxazole-trimethoprim]; Amoxicillin; and Penicillins  Review of Systems Review of Systems All other systems are reviewed and are negative for acute change except as noted in the HPI  Physical Exam Vital Signs  I have reviewed the triage  vital signs BP 118/72   Pulse 74   Temp 98.9 F (37.2 C) (Oral)   Resp 19   Ht 6\' 5"  (1.956 m)   Wt (!) 146.1 kg (322 lb)   SpO2 98%   BMI 38.18 kg/m    Physical Exam  Constitutional: He is oriented to person, place, and time. He appears well-developed and well-nourished. No distress.  HENT:  Head: Normocephalic and atraumatic.  Right Ear: External ear normal.  Left Ear: External ear normal.  Nose: Nose normal.  Mouth/Throat: Mucous membranes are normal. No trismus in the jaw.  Eyes: EOM are normal. Lids are everted and swept, no foreign bodies found. No foreign body present in the right eye. No foreign body present in the left eye. Right conjunctiva is injected. Left conjunctiva is injected. No scleral icterus.  Slit lamp exam:      The right eye shows no corneal abrasion and no  fluorescein uptake.       The left eye shows no corneal abrasion and no fluorescein uptake.  Acuity 20/20 OU (bilaterally)  Neck: Normal range of motion and phonation normal.  Cardiovascular: Normal rate and regular rhythm.  Pulmonary/Chest: Effort normal. No stridor. No respiratory distress.  Abdominal: He exhibits no distension.  Musculoskeletal: Normal range of motion. He exhibits no edema.  Neurological: He is alert and oriented to person, place, and time.  Skin: He is not diaphoretic.  Psychiatric: He has a normal mood and affect. His behavior is normal.  Vitals reviewed.   ED Results and Treatments Labs (all labs ordered are listed, but only abnormal results are displayed) Labs Reviewed - No data to display                                                                                                                       EKG  EKG Interpretation  Date/Time:    Ventricular Rate:    PR Interval:    QRS Duration:   QT Interval:    QTC Calculation:   R Axis:     Text Interpretation:        Radiology No results found. Pertinent labs & imaging results that were available during my  care of the patient were reviewed by me and considered in my medical decision making (see chart for details).  Medications Ordered in ED Medications  ofloxacin (OCUFLOX) 0.3 % ophthalmic solution 1 drop (1 drop Both Eyes Given 05/04/17 0913)  ketorolac (ACULAR) 0.5 % ophthalmic solution 1 drop (1 drop Both Eyes Given 05/04/17 0913)  Tdap (BOOSTRIX) injection 0.5 mL (not administered)  fluorescein 1 MG ophthalmic strip (  Given 05/04/17 0843)  tetracaine (PONTOCAINE) 0.5 % ophthalmic solution 1 drop (1 drop Both Eyes Given 05/04/17 0842)                                                                                                                                    Procedures Procedures  (including critical care time)  Medical Decision Making / ED Course I have reviewed the nursing notes for this encounter and the patient's prior records (if available in EHR or on provided paperwork).    Bilateral conjunctivitis.  Foreign body sensation in the right eye.  No obvious foreign bodies noted on exam.  No fluorescein uptake.  Likely chemical irritation versus small foreign body.  Irrigation was difficult  due to patient's PTSD however he was able to perform self irrigation over eyewash sink.  Tetanus updated.   We will provide the patient with prophylactic antibiotic ointment.  Discussed case with Dr. Vanessa BarbaraZamora who will see the patient in clinic following discharge from the emergency department for more detailed ocular exam.  The patient is safe for discharge with strict return precautions.   Final Clinical Impression(s) / ED Diagnoses Final diagnoses:  Acute conjunctivitis of both eyes, unspecified acute conjunctivitis type  Sensation of foreign body in eye   Disposition: Discharge  Condition: Good  I have discussed the results, Dx and Tx plan with the patient who expressed understanding and agree(s) with the plan. Discharge instructions discussed at great length. The patient was given strict return  precautions who verbalized understanding of the instructions. No further questions at time of discharge.    ED Discharge Orders    None       Follow Up: Rennis ChrisZamora, Brian, MD [address] [place] [address] [phone]  Go to  For close follow up      This chart was dictated using voice recognition software.  Despite best efforts to proofread,  errors can occur which can change the documentation meaning.  Nira Connardama, Pedro Eduardo, MD 05/04/17 [phone]

## 2017-05-04 NOTE — ED Notes (Signed)
D/c reviewed with patient  He is encouraged to go to Ophthalmologist from here

## 2017-05-04 NOTE — ED Notes (Signed)
ED Provider at bedside. 

## 2017-05-04 NOTE — ED Triage Notes (Signed)
Pt states he thinks he has something on his eyes, pt c/o 7/10 bilateral eye pain.

## 2017-08-09 ENCOUNTER — Encounter: Payer: Self-pay | Admitting: Emergency Medicine

## 2017-08-09 ENCOUNTER — Other Ambulatory Visit: Payer: Self-pay

## 2017-08-09 ENCOUNTER — Emergency Department
Admission: EM | Admit: 2017-08-09 | Discharge: 2017-08-09 | Disposition: A | Discharge status: Home / Self Care | Payer: Self-pay | Attending: Emergency Medicine | Admitting: Emergency Medicine

## 2017-08-09 DIAGNOSIS — R2231 Localized swelling, mass and lump, right upper limb: Secondary | ICD-10-CM | POA: Insufficient documentation

## 2017-08-09 DIAGNOSIS — Z5321 Procedure and treatment not carried out due to patient leaving prior to being seen by health care provider: Secondary | ICD-10-CM | POA: Insufficient documentation

## 2017-08-09 NOTE — ED Notes (Signed)
No answer when called from lobby 
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
## 2017-08-09 NOTE — ED Notes (Signed)
No answer when called several times from lobby 

## 2023-04-06 ENCOUNTER — Other Ambulatory Visit: Payer: Self-pay

## 2023-04-06 ENCOUNTER — Encounter (HOSPITAL_BASED_OUTPATIENT_CLINIC_OR_DEPARTMENT_OTHER): Payer: Self-pay

## 2023-04-06 ENCOUNTER — Emergency Department (HOSPITAL_BASED_OUTPATIENT_CLINIC_OR_DEPARTMENT_OTHER)
Admission: EM | Admit: 2023-04-06 | Discharge: 2023-04-06 | Disposition: A | Discharge status: Home / Self Care | Payer: Medicaid Other | Attending: Emergency Medicine | Admitting: Emergency Medicine

## 2023-04-06 DIAGNOSIS — S61201A Unspecified open wound of left index finger without damage to nail, initial encounter: Secondary | ICD-10-CM | POA: Diagnosis not present

## 2023-04-06 DIAGNOSIS — W268XXA Contact with other sharp object(s), not elsewhere classified, initial encounter: Secondary | ICD-10-CM | POA: Diagnosis not present

## 2023-04-06 DIAGNOSIS — T148XXA Other injury of unspecified body region, initial encounter: Secondary | ICD-10-CM

## 2023-04-06 DIAGNOSIS — S6992XA Unspecified injury of left wrist, hand and finger(s), initial encounter: Secondary | ICD-10-CM | POA: Diagnosis present

## 2023-04-06 DIAGNOSIS — Z23 Encounter for immunization: Secondary | ICD-10-CM | POA: Diagnosis not present

## 2023-04-06 DIAGNOSIS — S61211A Laceration without foreign body of left index finger without damage to nail, initial encounter: Secondary | ICD-10-CM | POA: Diagnosis not present

## 2023-04-06 MED ORDER — TETANUS-DIPHTH-ACELL PERTUSSIS 5-2.5-18.5 LF-MCG/0.5 IM SUSY
0.5000 mL | PREFILLED_SYRINGE | Freq: Once | INTRAMUSCULAR | Status: AC
  Administered 2023-04-06: 0.5 mL via INTRAMUSCULAR
  Filled 2023-04-06: qty 0.5

## 2023-04-06 NOTE — ED Provider Notes (Signed)
 [place] EMERGENCY DEPARTMENT AT [facility] Provider Note   CSN: [id number] Arrival date & time: 04/06/23  1111     History  Chief Complaint  Patient presents with   Laceration    Keelyn Tiberio is a 33 y.o. male.   Laceration  Patient is 33 year old male with no pertinent past medical history present emergency room today with complaints of laceration to his left second knuckle.  He states that this was sliced off with a razor blade when he reached into a trash can.  He states this happened greater than 48 hours ago.  He came to make sure that it was not infected he is uncertain of his last tetanus vaccine.  He states he has no issues with range of motion of his hand full range of motion of his fingers and hand has normal sensation and simply wanted to be checked out.  No other associated issues      Home Medications Prior to Admission medications   Medication Sig Start Date End Date Taking? Authorizing Provider  ibuprofen (ADVIL,MOTRIN) 200 MG tablet Take 200-800 mg by mouth every 6 (six) hours as needed for moderate pain.     [provider]      Allergies    Augmentin [amoxicillin-pot clavulanate], Septra [sulfamethoxazole-trimethoprim], Amoxicillin, and Penicillins    Review of Systems   Review of Systems  Physical Exam Updated Vital Signs BP 132/82   Pulse 72   Temp 98.2 F (36.8 C)   Resp 18   Ht 6' 4 (1.93 m)   Wt 136.1 kg   SpO2 100%   BMI 36.52 kg/m  Physical Exam Vitals and nursing note reviewed.  Constitutional:      General: He is not in acute distress.    Appearance: Normal appearance. He is not ill-appearing.  HENT:     Head: Normocephalic and atraumatic.  Eyes:     General: No scleral icterus.       Right eye: No discharge.        Left eye: No discharge.     Conjunctiva/sclera: Conjunctivae normal.  Pulmonary:     Effort: Pulmonary effort is normal.     Breath sounds: No stridor.  Musculoskeletal:        General:  Normal range of motion.  Skin:    General: Skin is warm and dry.     Comments: Approximately dime sized skin avulsion to the left second medical and dorsal aspect of the hand.  Full range of motion of all fingers, able to make a fist has normal sensation cap refill less than 2 seconds.  Neurological:     Mental Status: He is alert and oriented to person, place, and time. Mental status is at baseline.     ED Results / Procedures / Treatments   Labs (all labs ordered are listed, but only abnormal results are displayed) Labs Reviewed - No data to display  EKG None  Radiology No results found.  Procedures Procedures    Medications Ordered in ED Medications  Tdap (BOOSTRIX ) injection 0.5 mL (0.5 mLs Intramuscular Given 04/06/23 1433)    ED Course/ Medical Decision Making/ A&P                                 Medical Decision Making Risk Prescription drug management.   Patient is 33 year old male with no pertinent past medical history present emergency room today with complaints of laceration  to his left second knuckle.  He states that this was sliced off with a razor blade when he reached into a trash can.  He states this happened greater than 48 hours ago.  He came to make sure that it was not infected he is uncertain of his last tetanus vaccine.  He states he has no issues with range of motion of his hand full range of motion of his fingers and hand has normal sensation and simply wanted to be checked out.  No other associated issues  Physical exam reassuring  Given wound care instructions, provided bacitracin and bandages.  No evidence of infection.  Updated on Tdap.  Final Clinical Impression(s) / ED Diagnoses Final diagnoses:  Skin avulsion    Rx / DC Orders ED Discharge Orders     None         Neldon Hamp RAMAN, [place] 04/06/23 1515    Tonia Chew, MD 04/06/23 1600

## 2023-04-06 NOTE — Discharge Instructions (Signed)
 Keep wound clean with soap and water, dab dry and apply bacitracin. Keep covered when going outside or using her hands to prevent any dirt from entering the wound.

## 2023-04-06 NOTE — ED Triage Notes (Signed)
Cleaning car on 04/03/23 and cut left second knuckle dime size skin missing from knuckle
